# Patient Record
Sex: Male | Born: 1940 | Race: Black or African American | Hispanic: No
Health system: Southern US, Community
[De-identification: ages and names within clinical notes are randomized; demographics above are authoritative.]

---

## 2004-07-25 ENCOUNTER — Other Ambulatory Visit: Payer: Self-pay

## 2004-07-26 ENCOUNTER — Other Ambulatory Visit: Payer: Self-pay

## 2004-07-27 ENCOUNTER — Other Ambulatory Visit: Payer: Self-pay

## 2008-11-03 ENCOUNTER — Emergency Department: Payer: Self-pay | Admitting: Internal Medicine

## 2010-04-07 ENCOUNTER — Emergency Department: Payer: Self-pay | Admitting: Emergency Medicine

## 2010-08-09 ENCOUNTER — Inpatient Hospital Stay: Payer: Self-pay | Admitting: Internal Medicine

## 2010-08-29 ENCOUNTER — Ambulatory Visit: Payer: Self-pay | Admitting: Rheumatology

## 2010-09-19 ENCOUNTER — Emergency Department: Payer: Self-pay | Admitting: Emergency Medicine

## 2011-10-16 ENCOUNTER — Ambulatory Visit: Payer: Self-pay | Admitting: Neurology

## 2012-02-05 ENCOUNTER — Ambulatory Visit: Payer: Self-pay | Admitting: Neurology

## 2012-07-18 LAB — CBC WITH DIFFERENTIAL/PLATELET
Eosinophil #: 0.1 10*3/uL (ref 0.0–0.7)
Eosinophil %: 1.7 %
HCT: 37.4 % — ABNORMAL LOW (ref 40.0–52.0)
Lymphocyte #: 1.4 10*3/uL (ref 1.0–3.6)
MCH: 31.6 pg (ref 26.0–34.0)
MCHC: 33.5 g/dL (ref 32.0–36.0)
MCV: 94 fL (ref 80–100)
Monocyte #: 0.2 x10 3/mm (ref 0.2–1.0)
Neutrophil #: 2.4 10*3/uL (ref 1.4–6.5)
Neutrophil %: 58.4 %
Platelet: 167 10*3/uL (ref 150–440)
RBC: 3.96 10*6/uL — ABNORMAL LOW (ref 4.40–5.90)
RDW: 17.2 % — ABNORMAL HIGH (ref 11.5–14.5)

## 2012-07-18 LAB — COMPREHENSIVE METABOLIC PANEL
Alkaline Phosphatase: 88 U/L (ref 50–136)
Bilirubin,Total: 0.7 mg/dL (ref 0.2–1.0)
Calcium, Total: 7.9 mg/dL — ABNORMAL LOW (ref 8.5–10.1)
Co2: 32 mmol/L (ref 21–32)
Creatinine: 1.61 mg/dL — ABNORMAL HIGH (ref 0.60–1.30)
EGFR (Non-African Amer.): 43 — ABNORMAL LOW
Glucose: 88 mg/dL (ref 65–99)
Osmolality: 285 (ref 275–301)
Potassium: 2.6 mmol/L — ABNORMAL LOW (ref 3.5–5.1)
SGPT (ALT): 11 U/L — ABNORMAL LOW (ref 12–78)
Sodium: 143 mmol/L (ref 136–145)

## 2012-07-18 LAB — TROPONIN I: Troponin-I: 0.04 ng/mL

## 2012-07-18 LAB — SEDIMENTATION RATE: Erythrocyte Sed Rate: 61 mm/hr — ABNORMAL HIGH (ref 0–20)

## 2012-07-19 LAB — BASIC METABOLIC PANEL
BUN: 14 mg/dL (ref 7–18)
Creatinine: 1.27 mg/dL (ref 0.60–1.30)
EGFR (African American): >60
EGFR (Non-African Amer.): 57 — ABNORMAL LOW
Glucose: 92 mg/dL (ref 65–99)
Osmolality: 289 (ref 275–301)
Sodium: 145 mmol/L (ref 136–145)

## 2012-07-19 LAB — PHOSPHORUS: Phosphorus: 1.7 mg/dL — ABNORMAL LOW (ref 2.5–4.9)

## 2012-07-19 LAB — TSH: Thyroid Stimulating Horm: 2.41 u[IU]/mL

## 2012-07-20 ENCOUNTER — Inpatient Hospital Stay: Payer: Self-pay

## 2012-07-20 LAB — BASIC METABOLIC PANEL
Calcium, Total: 7.5 mg/dL — ABNORMAL LOW (ref 8.5–10.1)
Co2: 26 mmol/L (ref 21–32)
Creatinine: 1.29 mg/dL (ref 0.60–1.30)
EGFR (African American): >60
EGFR (Non-African Amer.): 56 — ABNORMAL LOW
Potassium: 3 mmol/L — ABNORMAL LOW (ref 3.5–5.1)
Sodium: 141 mmol/L (ref 136–145)

## 2012-07-20 LAB — MAGNESIUM: Magnesium: 1.4 mg/dL — ABNORMAL LOW

## 2012-07-20 LAB — PHOSPHORUS: Phosphorus: 2.1 mg/dL — ABNORMAL LOW (ref 2.5–4.9)

## 2012-07-21 LAB — BASIC METABOLIC PANEL
Anion Gap: 6 — ABNORMAL LOW (ref 7–16)
Calcium, Total: 7.6 mg/dL — ABNORMAL LOW (ref 8.5–10.1)
Co2: 25 mmol/L (ref 21–32)
EGFR (African American): >60
EGFR (Non-African Amer.): 58 — ABNORMAL LOW
Glucose: 80 mg/dL (ref 65–99)

## 2012-07-21 LAB — PROTEIN ELECTROPHORESIS(ARMC)

## 2012-07-21 LAB — PHOSPHORUS: Phosphorus: 2.2 mg/dL — ABNORMAL LOW (ref 2.5–4.9)

## 2012-07-22 LAB — UR PROT ELECTROPHORESIS, URINE RANDOM

## 2012-08-29 LAB — COMPREHENSIVE METABOLIC PANEL
Albumin: 3.9 g/dL (ref 3.4–5.0)
BUN: 20 mg/dL — ABNORMAL HIGH (ref 7–18)
Creatinine: 1.88 mg/dL — ABNORMAL HIGH (ref 0.60–1.30)
EGFR (African American): 41 — ABNORMAL LOW
Glucose: 88 mg/dL (ref 65–99)
Osmolality: 280 (ref 275–301)
Potassium: 3.1 mmol/L — ABNORMAL LOW (ref 3.5–5.1)
SGOT(AST): 61 U/L — ABNORMAL HIGH (ref 15–37)
SGPT (ALT): 16 U/L (ref 12–78)
Total Protein: 10.1 g/dL — ABNORMAL HIGH (ref 6.4–8.2)

## 2012-08-29 LAB — URINALYSIS, COMPLETE
Bacteria: NONE SEEN
Bilirubin,UR: NEGATIVE
Glucose,UR: NEGATIVE mg/dL (ref 0–75)
Ketone: NEGATIVE
Leukocyte Esterase: NEGATIVE
Nitrite: NEGATIVE
Protein: NEGATIVE
Specific Gravity: 1.009 (ref 1.003–1.030)
Squamous Epithelial: NONE SEEN
WBC UR: <1 /HPF (ref 0–5)

## 2012-08-29 LAB — CBC
HGB: 16.4 g/dL (ref 13.0–18.0)
MCH: 31.6 pg (ref 26.0–34.0)
MCHC: 33.9 g/dL (ref 32.0–36.0)
RBC: 5.18 10*6/uL (ref 4.40–5.90)
WBC: 11.6 10*3/uL — ABNORMAL HIGH (ref 3.8–10.6)

## 2012-08-30 ENCOUNTER — Inpatient Hospital Stay: Payer: Self-pay

## 2012-08-31 LAB — CBC WITH DIFFERENTIAL/PLATELET
Basophil #: 0 10*3/uL (ref 0.0–0.1)
Eosinophil %: 0.4 %
HCT: 31.1 % — ABNORMAL LOW (ref 40.0–52.0)
HGB: 10.6 g/dL — ABNORMAL LOW (ref 13.0–18.0)
MCH: 31.8 pg (ref 26.0–34.0)
MCHC: 34 g/dL (ref 32.0–36.0)
Monocyte #: 0.4 x10 3/mm (ref 0.2–1.0)
Monocyte %: 6.1 %
Neutrophil #: 4 10*3/uL (ref 1.4–6.5)
Neutrophil %: 61.9 %
Platelet: 131 10*3/uL — ABNORMAL LOW (ref 150–440)
WBC: 6.5 10*3/uL (ref 3.8–10.6)

## 2012-08-31 LAB — BASIC METABOLIC PANEL
Anion Gap: 10 (ref 7–16)
BUN: 22 mg/dL — ABNORMAL HIGH (ref 7–18)
Calcium, Total: 7.9 mg/dL — ABNORMAL LOW (ref 8.5–10.1)
Chloride: 107 mmol/L (ref 98–107)
Co2: 28 mmol/L (ref 21–32)
Osmolality: 292 (ref 275–301)
Potassium: 3 mmol/L — ABNORMAL LOW (ref 3.5–5.1)

## 2012-09-01 LAB — BASIC METABOLIC PANEL
Anion Gap: 7 (ref 7–16)
BUN: 20 mg/dL — ABNORMAL HIGH (ref 7–18)
Calcium, Total: 7.9 mg/dL — ABNORMAL LOW (ref 8.5–10.1)
Chloride: 109 mmol/L — ABNORMAL HIGH (ref 98–107)
Co2: 28 mmol/L (ref 21–32)
Creatinine: 1.31 mg/dL — ABNORMAL HIGH (ref 0.60–1.30)
EGFR (African American): >60
EGFR (Non-African Amer.): 55 — ABNORMAL LOW
Glucose: 77 mg/dL (ref 65–99)
Osmolality: 288 (ref 275–301)
Potassium: 3.7 mmol/L (ref 3.5–5.1)
Sodium: 144 mmol/L (ref 136–145)

## 2012-09-01 LAB — CBC WITH DIFFERENTIAL/PLATELET
Basophil #: 0 10*3/uL (ref 0.0–0.1)
Basophil %: 0.6 %
Eosinophil #: 0.1 10*3/uL (ref 0.0–0.7)
Eosinophil %: 2.5 %
HCT: 33.1 % — ABNORMAL LOW (ref 40.0–52.0)
MCH: 32.1 pg (ref 26.0–34.0)
MCV: 94 fL (ref 80–100)
Monocyte %: 6.8 %
RBC: 3.54 10*6/uL — ABNORMAL LOW (ref 4.40–5.90)
RDW: 14.2 % (ref 11.5–14.5)
WBC: 4.8 10*3/uL (ref 3.8–10.6)

## 2012-09-01 LAB — MAGNESIUM: Magnesium: 1.6 mg/dL — ABNORMAL LOW

## 2012-09-02 LAB — CBC WITH DIFFERENTIAL/PLATELET
Basophil #: 0 10*3/uL (ref 0.0–0.1)
Basophil %: 0.7 %
Eosinophil #: 0.1 10*3/uL (ref 0.0–0.7)
HCT: 32.7 % — ABNORMAL LOW (ref 40.0–52.0)
Lymphocyte #: 1.9 10*3/uL (ref 1.0–3.6)
Lymphocyte %: 33.7 %
MCHC: 34.9 g/dL (ref 32.0–36.0)
Monocyte %: 7.2 %
Neutrophil #: 3.2 10*3/uL (ref 1.4–6.5)
Platelet: 150 10*3/uL (ref 150–440)
RBC: 3.54 10*6/uL — ABNORMAL LOW (ref 4.40–5.90)
RDW: 14.5 % (ref 11.5–14.5)
WBC: 5.7 10*3/uL (ref 3.8–10.6)

## 2012-09-09 ENCOUNTER — Emergency Department: Payer: Self-pay | Admitting: Emergency Medicine

## 2012-09-09 LAB — URINALYSIS, COMPLETE
Glucose,UR: NEGATIVE mg/dL (ref 0–75)
Ph: 7 (ref 4.5–8.0)
RBC,UR: 20 /HPF (ref 0–5)
Squamous Epithelial: <1
WBC UR: 87 /HPF (ref 0–5)

## 2012-09-09 LAB — COMPREHENSIVE METABOLIC PANEL
Alkaline Phosphatase: 72 U/L (ref 50–136)
Anion Gap: 12 (ref 7–16)
BUN: 15 mg/dL (ref 7–18)
Bilirubin,Total: 0.6 mg/dL (ref 0.2–1.0)
Calcium, Total: 7.7 mg/dL — ABNORMAL LOW (ref 8.5–10.1)
Chloride: 109 mmol/L — ABNORMAL HIGH (ref 98–107)
Co2: 21 mmol/L (ref 21–32)
Creatinine: 1.33 mg/dL — ABNORMAL HIGH (ref 0.60–1.30)
EGFR (Non-African Amer.): 54 — ABNORMAL LOW
Glucose: 90 mg/dL (ref 65–99)
Potassium: 3.5 mmol/L (ref 3.5–5.1)
SGOT(AST): 9 U/L — ABNORMAL LOW (ref 15–37)
SGPT (ALT): 12 U/L (ref 12–78)
Total Protein: 6.8 g/dL (ref 6.4–8.2)

## 2012-09-09 LAB — CBC
HCT: 33.1 % — ABNORMAL LOW (ref 40.0–52.0)
HGB: 11.3 g/dL — ABNORMAL LOW (ref 13.0–18.0)
MCH: 31.8 pg (ref 26.0–34.0)
MCHC: 34.1 g/dL (ref 32.0–36.0)
MCV: 93 fL (ref 80–100)
RBC: 3.56 10*6/uL — ABNORMAL LOW (ref 4.40–5.90)
WBC: 7.1 10*3/uL (ref 3.8–10.6)

## 2012-09-09 LAB — TROPONIN I: Troponin-I: 0.03 ng/mL

## 2012-09-09 LAB — CK TOTAL AND CKMB (NOT AT ARMC)
CK, Total: 34 U/L — ABNORMAL LOW (ref 35–232)
CK-MB: 0.6 ng/mL (ref 0.5–3.6)

## 2012-09-14 LAB — CULTURE, BLOOD (SINGLE)

## 2014-06-04 ENCOUNTER — Inpatient Hospital Stay: Payer: Self-pay | Admitting: Internal Medicine

## 2014-06-04 LAB — BASIC METABOLIC PANEL
ANION GAP: 7 (ref 7–16)
BUN: 25 mg/dL — AB (ref 7–18)
CREATININE: 2.07 mg/dL — AB (ref 0.60–1.30)
Calcium, Total: 8.8 mg/dL (ref 8.5–10.1)
Chloride: 114 mmol/L — ABNORMAL HIGH (ref 98–107)
Co2: 23 mmol/L (ref 21–32)
EGFR (African American): 36 — ABNORMAL LOW
GFR CALC NON AF AMER: 31 — AB
Glucose: 98 mg/dL (ref 65–99)
OSMOLALITY: 291 (ref 275–301)
POTASSIUM: 3.7 mmol/L (ref 3.5–5.1)
SODIUM: 144 mmol/L (ref 136–145)

## 2014-06-04 LAB — URINALYSIS, COMPLETE
Glucose,UR: NEGATIVE mg/dL (ref 0–75)
Leukocyte Esterase: NEGATIVE
Nitrite: NEGATIVE
Ph: 5 (ref 4.5–8.0)
RBC,UR: 37 /HPF (ref 0–5)
Specific Gravity: 1.028 (ref 1.003–1.030)

## 2014-06-04 LAB — CBC
HCT: 38.6 % — AB (ref 40.0–52.0)
HGB: 12.7 g/dL — ABNORMAL LOW (ref 13.0–18.0)
MCH: 30.5 pg (ref 26.0–34.0)
MCHC: 32.9 g/dL (ref 32.0–36.0)
MCV: 93 fL (ref 80–100)
Platelet: 194 10*3/uL (ref 150–440)
RBC: 4.17 10*6/uL — ABNORMAL LOW (ref 4.40–5.90)
RDW: 15.7 % — ABNORMAL HIGH (ref 11.5–14.5)
WBC: 5.7 10*3/uL (ref 3.8–10.6)

## 2014-06-04 LAB — CK TOTAL AND CKMB (NOT AT ARMC)
CK, Total: 40 U/L
CK-MB: 0.7 ng/mL (ref 0.5–3.6)

## 2014-06-04 LAB — TROPONIN I: TROPONIN-I: 0.03 ng/mL

## 2014-06-05 ENCOUNTER — Ambulatory Visit: Payer: Self-pay | Admitting: Neurology

## 2014-06-05 LAB — CBC WITH DIFFERENTIAL/PLATELET
Basophil #: 0.1 10*3/uL (ref 0.0–0.1)
Basophil %: 0.9 %
Eosinophil #: 0.1 10*3/uL (ref 0.0–0.7)
Eosinophil %: 2.3 %
HCT: 37.9 % — AB (ref 40.0–52.0)
HGB: 12.4 g/dL — AB (ref 13.0–18.0)
LYMPHS PCT: 25.9 %
Lymphocyte #: 1.4 10*3/uL (ref 1.0–3.6)
MCH: 30.5 pg (ref 26.0–34.0)
MCHC: 32.8 g/dL (ref 32.0–36.0)
MCV: 93 fL (ref 80–100)
Monocyte #: 0.3 x10 3/mm (ref 0.2–1.0)
Monocyte %: 5.7 %
Neutrophil #: 3.6 10*3/uL (ref 1.4–6.5)
Neutrophil %: 65.2 %
Platelet: 172 10*3/uL (ref 150–440)
RBC: 4.08 10*6/uL — AB (ref 4.40–5.90)
RDW: 16.1 % — ABNORMAL HIGH (ref 11.5–14.5)
WBC: 5.6 10*3/uL (ref 3.8–10.6)

## 2014-06-05 LAB — BASIC METABOLIC PANEL
Anion Gap: 8 (ref 7–16)
BUN: 22 mg/dL — ABNORMAL HIGH (ref 7–18)
CALCIUM: 8 mg/dL — AB (ref 8.5–10.1)
CREATININE: 1.54 mg/dL — AB (ref 0.60–1.30)
Chloride: 113 mmol/L — ABNORMAL HIGH (ref 98–107)
Co2: 22 mmol/L (ref 21–32)
EGFR (African American): 51 — ABNORMAL LOW
EGFR (Non-African Amer.): 44 — ABNORMAL LOW
Glucose: 79 mg/dL (ref 65–99)
Osmolality: 287 (ref 275–301)
Potassium: 3.2 mmol/L — ABNORMAL LOW (ref 3.5–5.1)
SODIUM: 143 mmol/L (ref 136–145)

## 2014-06-05 LAB — LIPID PANEL
Cholesterol: 129 mg/dL (ref 0–200)
HDL: 35 mg/dL — AB (ref 40–60)
Ldl Cholesterol, Calc: 83 mg/dL (ref 0–100)
TRIGLYCERIDES: 56 mg/dL (ref 0–200)
VLDL CHOLESTEROL, CALC: 11 mg/dL (ref 5–40)

## 2014-06-06 LAB — BASIC METABOLIC PANEL
Anion Gap: 6 — ABNORMAL LOW (ref 7–16)
BUN: 22 mg/dL — AB (ref 7–18)
CREATININE: 1.52 mg/dL — AB (ref 0.60–1.30)
Calcium, Total: 7.5 mg/dL — ABNORMAL LOW (ref 8.5–10.1)
Chloride: 115 mmol/L — ABNORMAL HIGH (ref 98–107)
Co2: 22 mmol/L (ref 21–32)
EGFR (Non-African Amer.): 45 — ABNORMAL LOW
GFR CALC AF AMER: 52 — AB
Glucose: 82 mg/dL (ref 65–99)
Osmolality: 287 (ref 275–301)
Potassium: 3.7 mmol/L (ref 3.5–5.1)
SODIUM: 143 mmol/L (ref 136–145)

## 2014-06-06 LAB — URINE CULTURE

## 2014-06-07 LAB — BASIC METABOLIC PANEL
ANION GAP: 7 (ref 7–16)
BUN: 16 mg/dL (ref 7–18)
CHLORIDE: 114 mmol/L — AB (ref 98–107)
Calcium, Total: 7.5 mg/dL — ABNORMAL LOW (ref 8.5–10.1)
Co2: 22 mmol/L (ref 21–32)
Creatinine: 1.22 mg/dL (ref 0.60–1.30)
EGFR (African American): >60
EGFR (Non-African Amer.): 59 — ABNORMAL LOW
Glucose: 78 mg/dL (ref 65–99)
Osmolality: 285 (ref 275–301)
Potassium: 3.7 mmol/L (ref 3.5–5.1)
SODIUM: 143 mmol/L (ref 136–145)

## 2014-06-09 LAB — CULTURE, BLOOD (SINGLE)

## 2014-06-23 ENCOUNTER — Emergency Department: Payer: Self-pay | Admitting: Internal Medicine

## 2014-06-23 LAB — COMPREHENSIVE METABOLIC PANEL
ALBUMIN: 3.3 g/dL — AB (ref 3.4–5.0)
ALK PHOS: 79 U/L
ANION GAP: 8 (ref 7–16)
AST: 21 U/L (ref 15–37)
BUN: 40 mg/dL — AB (ref 7–18)
Bilirubin,Total: 0.4 mg/dL (ref 0.2–1.0)
Calcium, Total: 9.1 mg/dL (ref 8.5–10.1)
Chloride: 110 mmol/L — ABNORMAL HIGH (ref 98–107)
Co2: 22 mmol/L (ref 21–32)
Creatinine: 1.89 mg/dL — ABNORMAL HIGH (ref 0.60–1.30)
EGFR (African American): 40 — ABNORMAL LOW
GFR CALC NON AF AMER: 35 — AB
Glucose: 80 mg/dL (ref 65–99)
Osmolality: 288 (ref 275–301)
POTASSIUM: 5.6 mmol/L — AB (ref 3.5–5.1)
SGPT (ALT): 14 U/L
SODIUM: 140 mmol/L (ref 136–145)
Total Protein: 8.7 g/dL — ABNORMAL HIGH (ref 6.4–8.2)

## 2014-06-23 LAB — CBC WITH DIFFERENTIAL/PLATELET
Basophil #: 0.1 10*3/uL (ref 0.0–0.1)
Basophil %: 1.4 %
Eosinophil #: 0.1 10*3/uL (ref 0.0–0.7)
Eosinophil %: 2.9 %
HCT: 41.1 % (ref 40.0–52.0)
HGB: 13.1 g/dL (ref 13.0–18.0)
Lymphocyte #: 1.3 10*3/uL (ref 1.0–3.6)
Lymphocyte %: 26.4 %
MCH: 30 pg (ref 26.0–34.0)
MCHC: 31.8 g/dL — AB (ref 32.0–36.0)
MCV: 94 fL (ref 80–100)
Monocyte #: 0.3 x10 3/mm (ref 0.2–1.0)
Monocyte %: 5.1 %
Neutrophil #: 3.2 10*3/uL (ref 1.4–6.5)
Neutrophil %: 64.2 %
Platelet: 218 10*3/uL (ref 150–440)
RBC: 4.36 10*6/uL — ABNORMAL LOW (ref 4.40–5.90)
RDW: 15.9 % — ABNORMAL HIGH (ref 11.5–14.5)
WBC: 5 10*3/uL (ref 3.8–10.6)

## 2014-06-23 LAB — URINALYSIS, COMPLETE
BILIRUBIN, UR: NEGATIVE
Bacteria: NONE SEEN
Glucose,UR: NEGATIVE mg/dL (ref 0–75)
KETONE: NEGATIVE
Leukocyte Esterase: NEGATIVE
Nitrite: NEGATIVE
Ph: 5 (ref 4.5–8.0)
Protein: NEGATIVE
Specific Gravity: 1.014 (ref 1.003–1.030)
Squamous Epithelial: <1

## 2014-06-23 LAB — TROPONIN I: Troponin-I: <0.02 ng/mL

## 2014-06-28 ENCOUNTER — Other Ambulatory Visit: Payer: Self-pay | Admitting: Internal Medicine

## 2014-06-28 LAB — BASIC METABOLIC PANEL
Anion Gap: 7 (ref 7–16)
BUN: 48 mg/dL — ABNORMAL HIGH (ref 7–18)
CHLORIDE: 112 mmol/L — AB (ref 98–107)
Calcium, Total: 8.2 mg/dL — ABNORMAL LOW (ref 8.5–10.1)
Co2: 23 mmol/L (ref 21–32)
Creatinine: 2.21 mg/dL — ABNORMAL HIGH (ref 0.60–1.30)
EGFR (African American): 33 — ABNORMAL LOW
EGFR (Non-African Amer.): 29 — ABNORMAL LOW
Glucose: 94 mg/dL (ref 65–99)
OSMOLALITY: 295 (ref 275–301)
POTASSIUM: 5.7 mmol/L — AB (ref 3.5–5.1)
SODIUM: 142 mmol/L (ref 136–145)

## 2014-08-31 IMAGING — US US CAROTID DUPLEX BILAT
1 series · 13 of 24 positions shown · non-contrast
Comparison: Prior brain MRI 06/05/2014 ; prior duplex carotid
ultrasound 08/09/2010

CLINICAL DATA: Acute cerebral vascular accident

EXAM:
BILATERAL CAROTID DUPLEX ULTRASOUND
TECHNIQUE: Gray scale imaging, color Doppler and duplex ultrasound were
performed of bilateral carotid and vertebral arteries in the neck.

[Series 1: us carotid duplex bilat · 0.05mm/px · 13 of 55 slices shown]
[im 1/55]
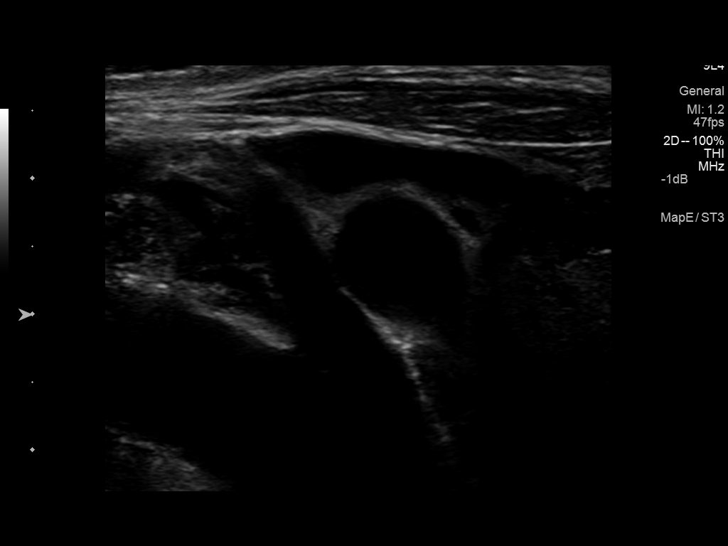
[im 5/55]
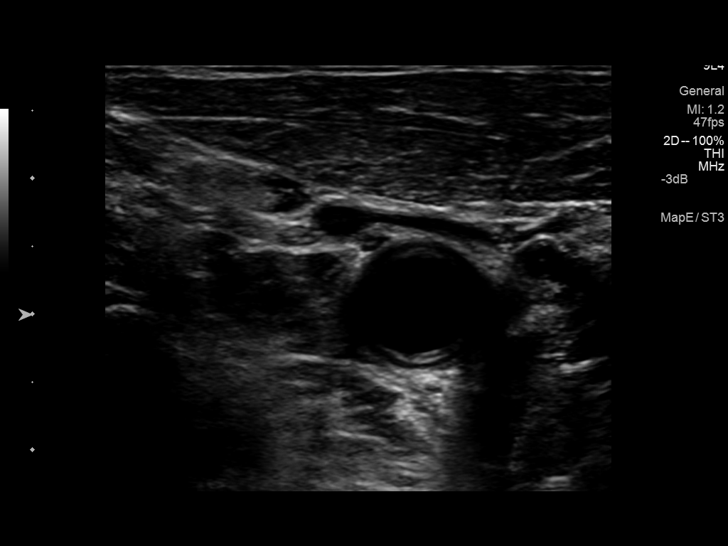
[im 10/55]
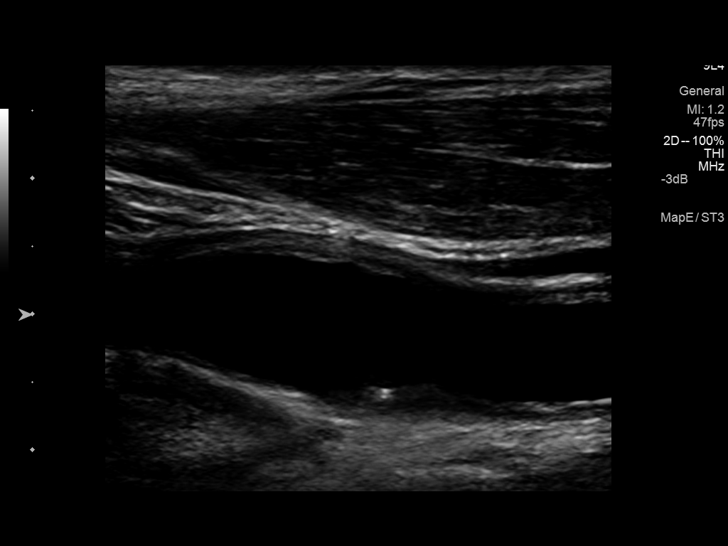
[im 15/55]
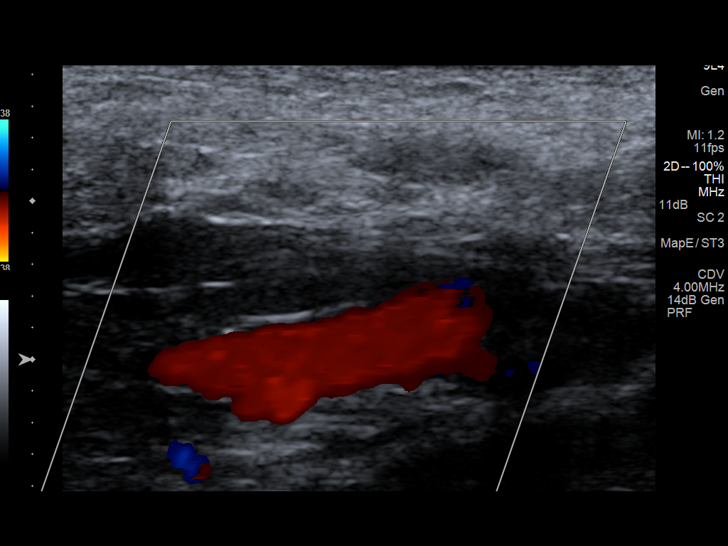
[im 19/55]
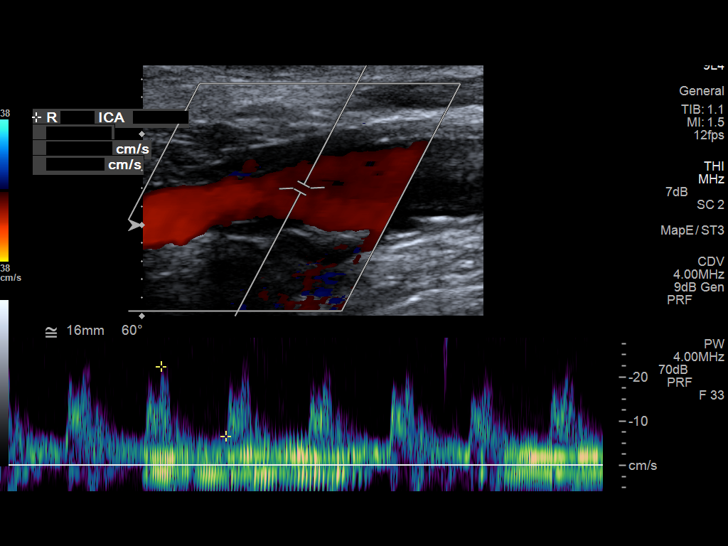
[im 24/55]
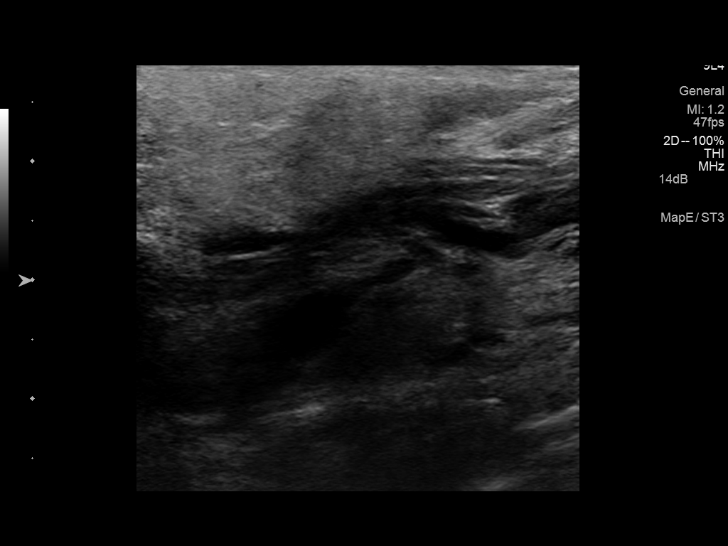
[im 29/55]
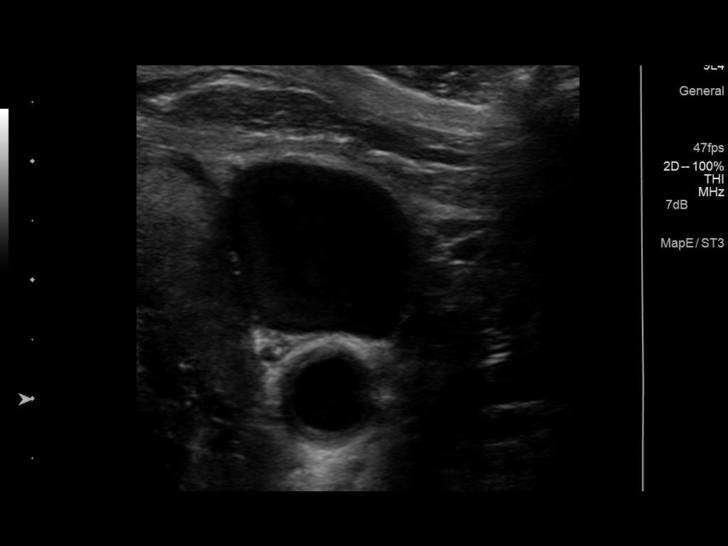
[im 31/55]
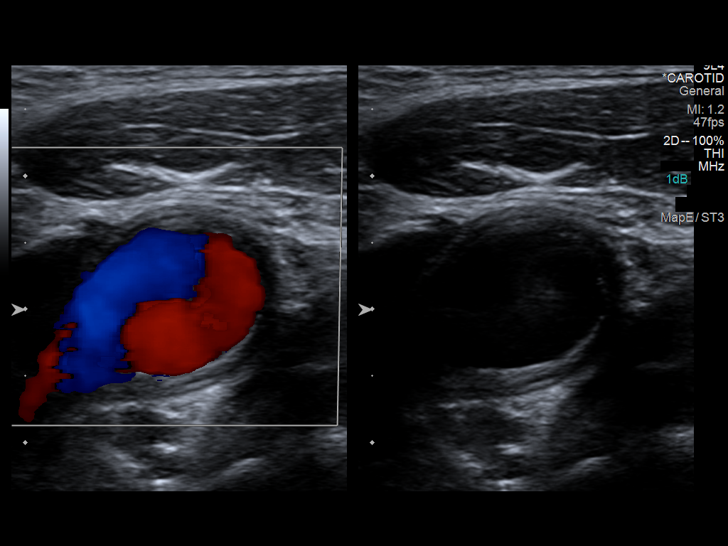
[im 36/55]
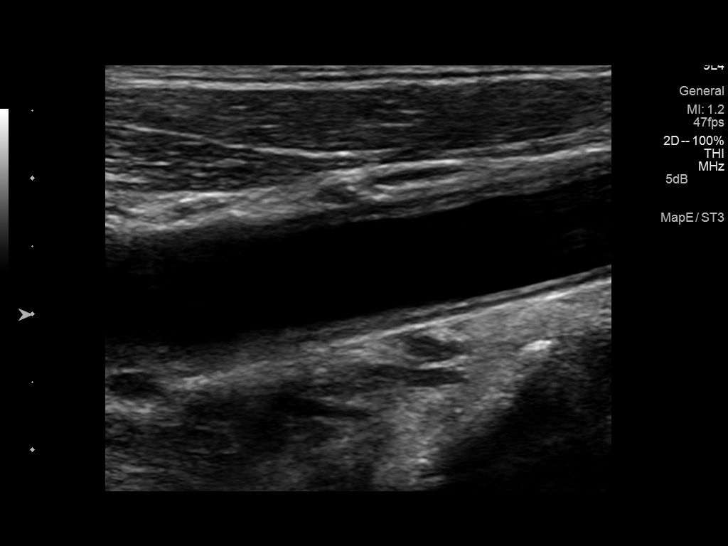
[im 40/55]
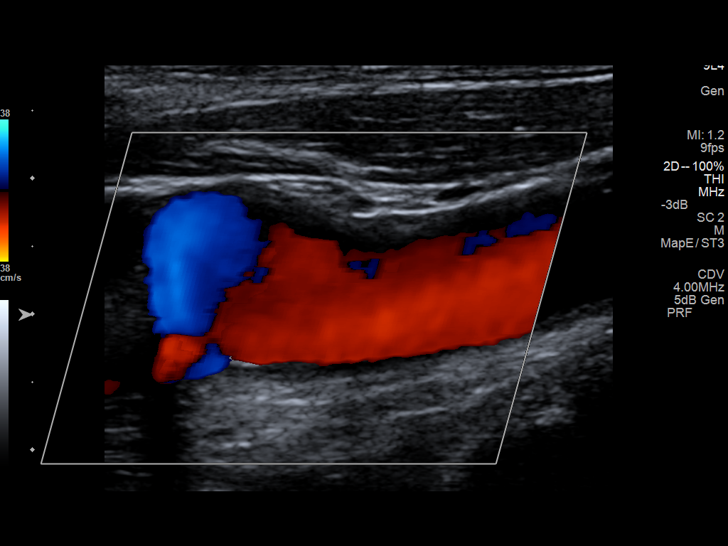
[im 45/55]
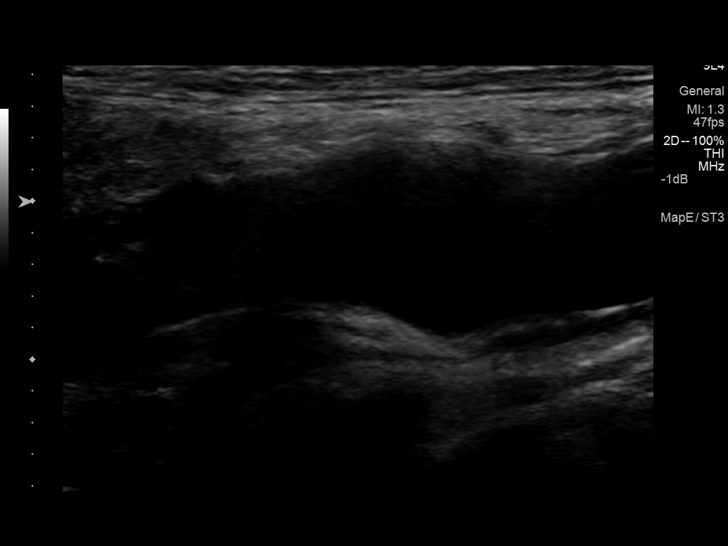
[im 50/55]
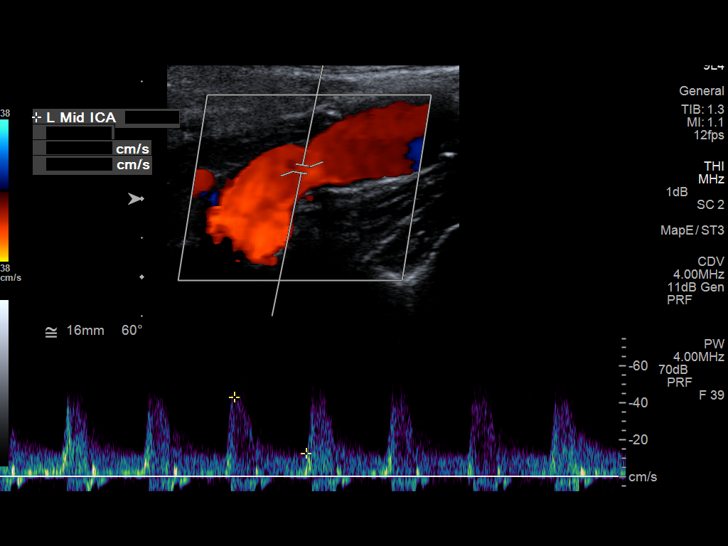
[im 55/55]
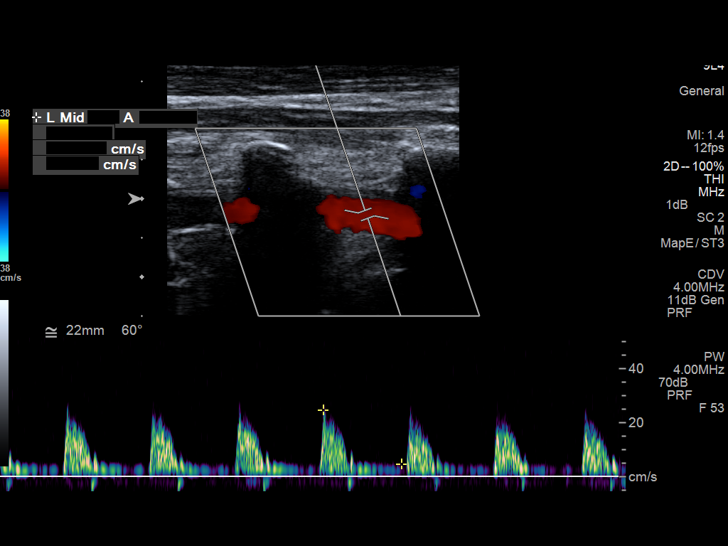

[13 of 24 positions shown; findings below may reference images not displayed]

FINDINGS: Criteria: Quantification of carotid stenosis is based on velocity
parameters that correlate the residual internal carotid diameter
with NASCET-based stenosis levels, using the diameter of the distal
internal carotid lumen as the denominator for stenosis measurement.

The following velocity measurements were obtained:

RIGHT

ICA:  57/17 cm/sec

CCA:  113/16 cm/sec

SYSTOLIC ICA/CCA RATIO:

DIASTOLIC ICA/CCA RATIO:

ECA:  46 cm/sec

LEFT

ICA:  43/13 cm/sec

CCA:  92/15 cm/sec

SYSTOLIC ICA/CCA RATIO:

DIASTOLIC ICA/CCA RATIO:

ECA:  48 cm/sec

RIGHT CAROTID ARTERY: Trace atherosclerotic plaque in the carotid
bifurcation without evidence of significant stenosis.

RIGHT VERTEBRAL ARTERY:  Patent with normal antegrade flow.

LEFT CAROTID ARTERY: Trace smooth atherosclerotic plaque in the
carotid bifurcation without evidence of stenosis.

LEFT VERTEBRAL ARTERY:  Patent with normal antegrade flow.
IMPRESSION: 1. Trace heterogeneous but relatively smooth plaque in the bilateral
carotid bifurcations without evidence of ICA stenosis.
2. Vertebral arteries remain patent with normal antegrade flow.

## 2014-09-18 IMAGING — CT CT HEAD WITHOUT CONTRAST
1 series · 16 of 30 positions shown, 20 images · non-contrast
Comparison: CT 06/04/2014

CLINICAL DATA: Mental status change

EXAM:
CT HEAD WITHOUT CONTRAST
TECHNIQUE: Contiguous axial images were obtained from the base of the skull
through the vertex without intravenous contrast.

[Series 2: soft tissue · axial · 0.42mm/px · z∈[-34,+100]mm · 16 of 30 slices shown, 20 images]
[im 2/30  brain]
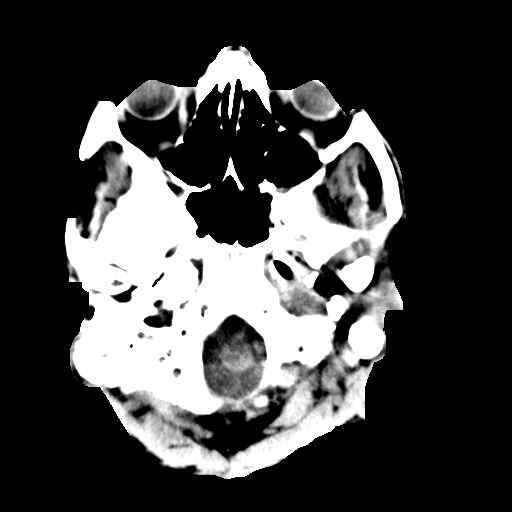
[im 2/30  bone]
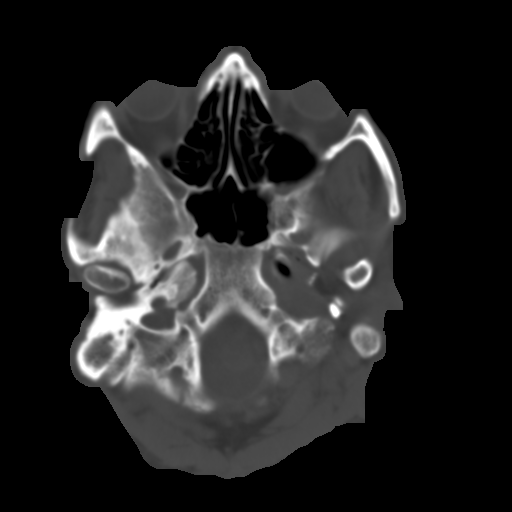
[im 4/30  brain]
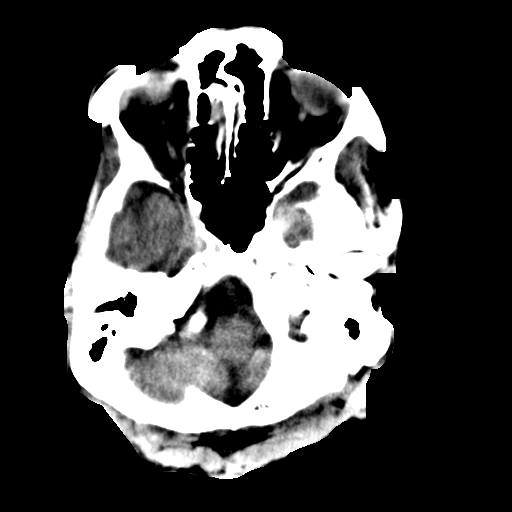
[im 6/30  brain]
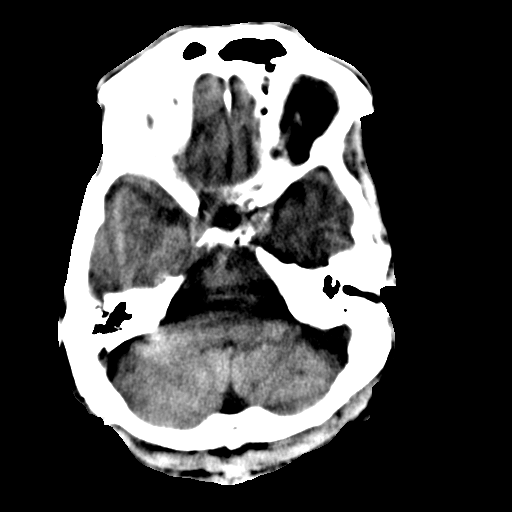
[im 8/30  brain]
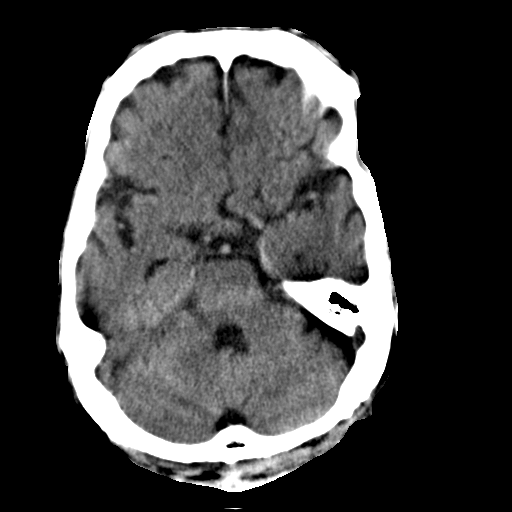
[im 9/30  brain]
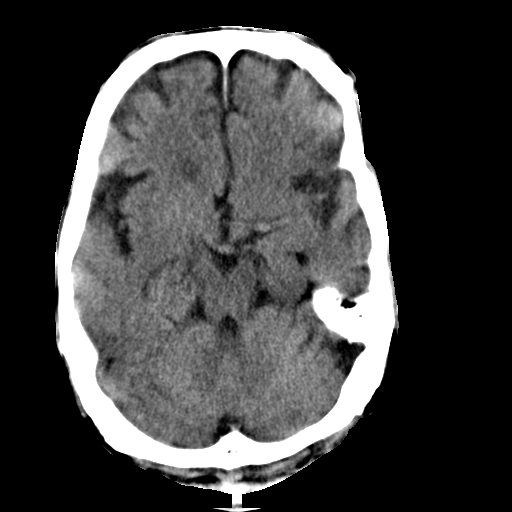
[im 9/30  bone]
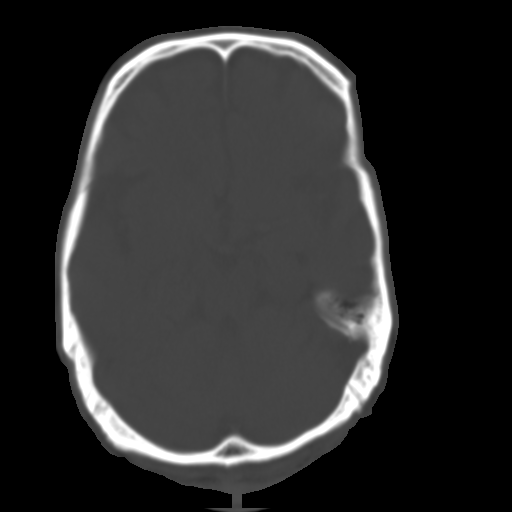
[im 11/30  brain]
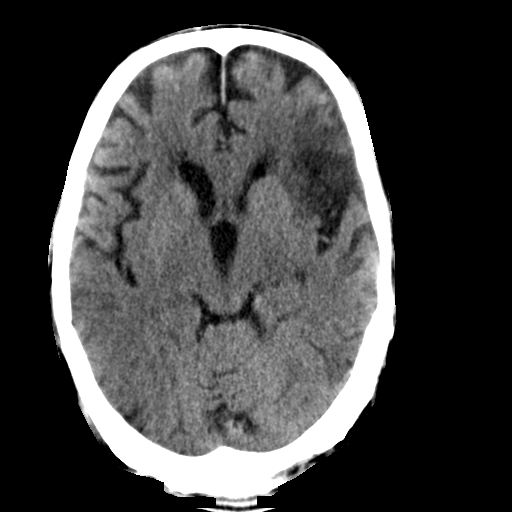
[im 13/30  brain]
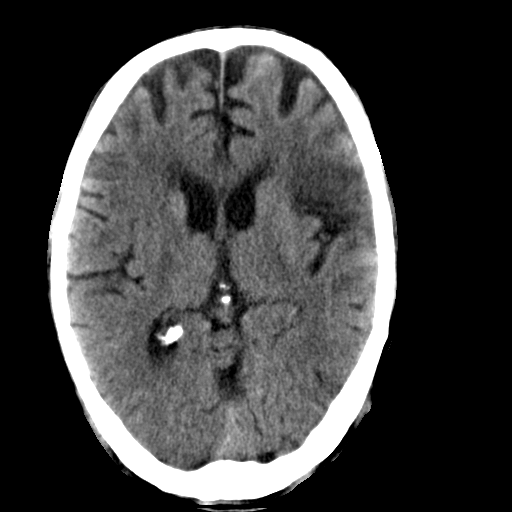
[im 15/30  brain]
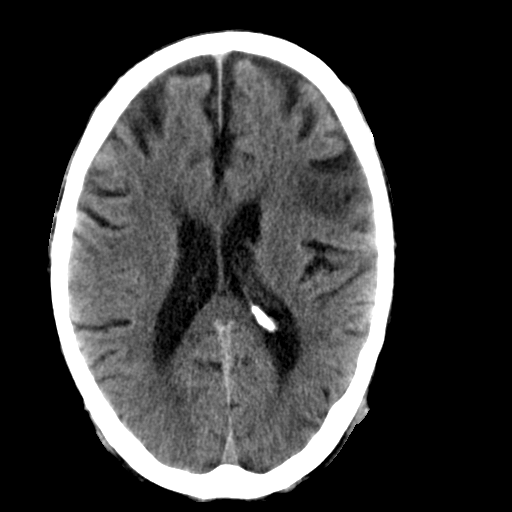
[im 16/30  brain]
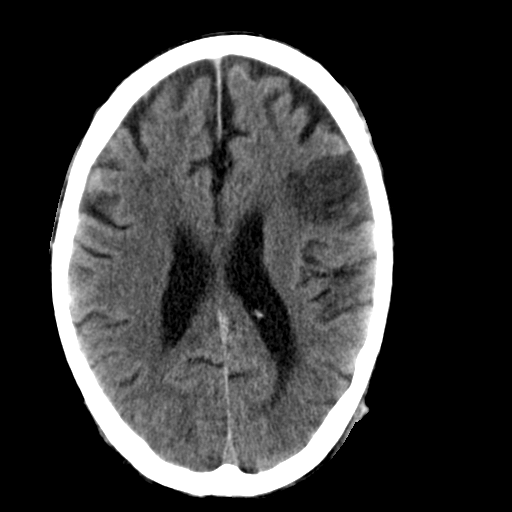
[im 16/30  bone]
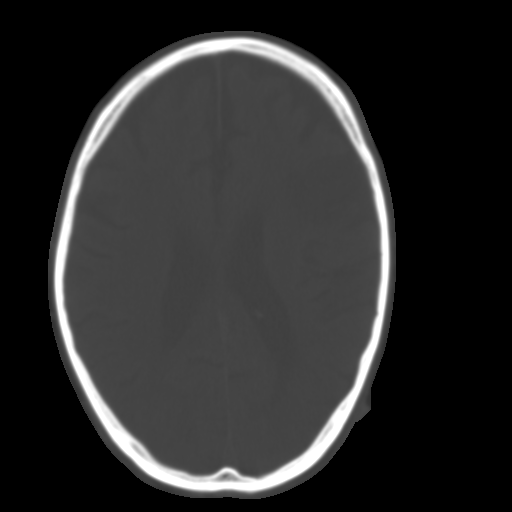
[im 18/30  brain]
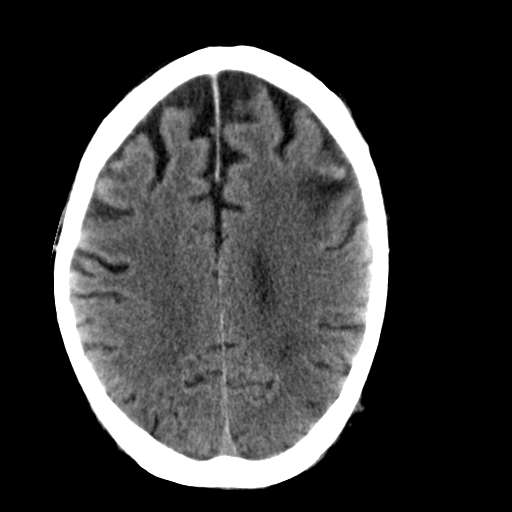
[im 20/30  brain]
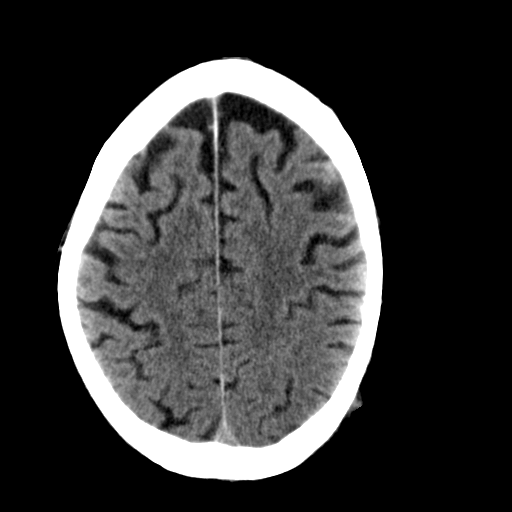
[im 22/30  brain]
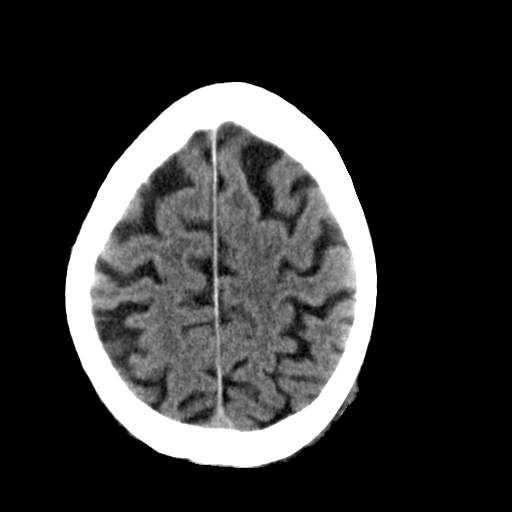
[im 23/30  brain]
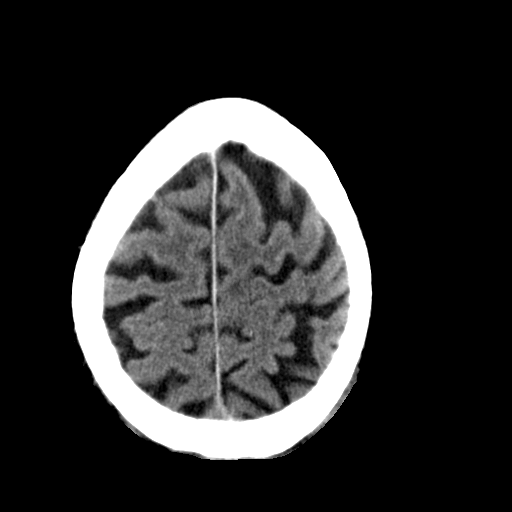
[im 23/30  bone]
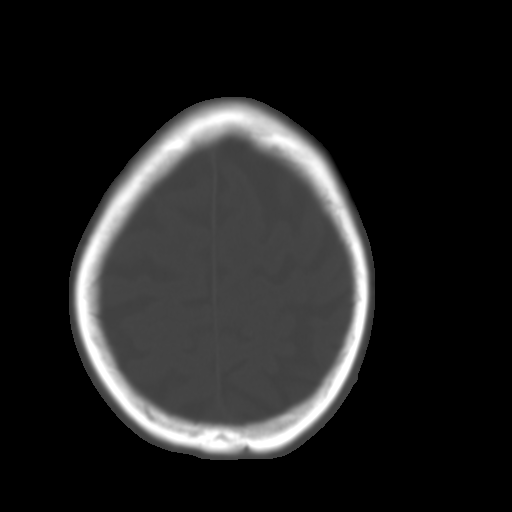
[im 25/30  brain]
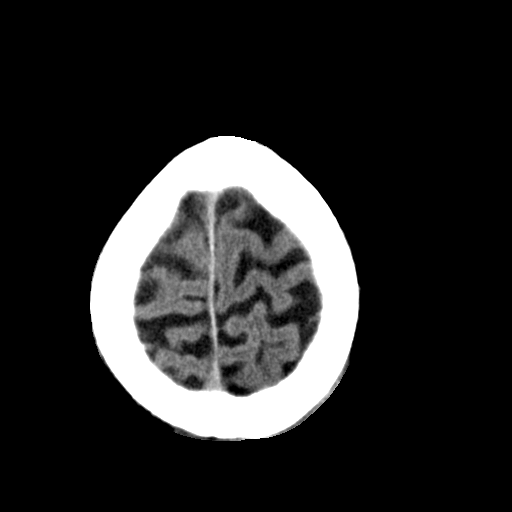
[im 27/30  brain]
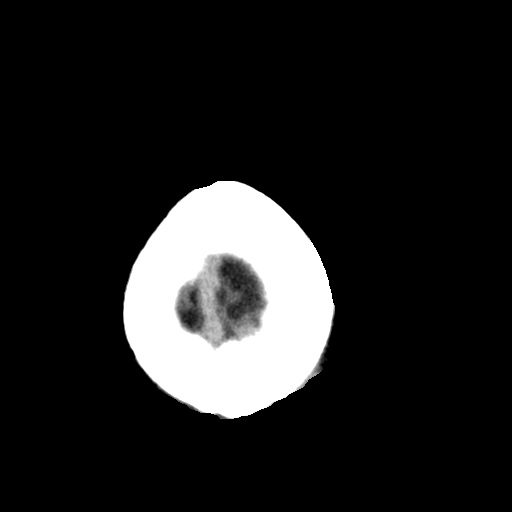
[im 29/30  brain]
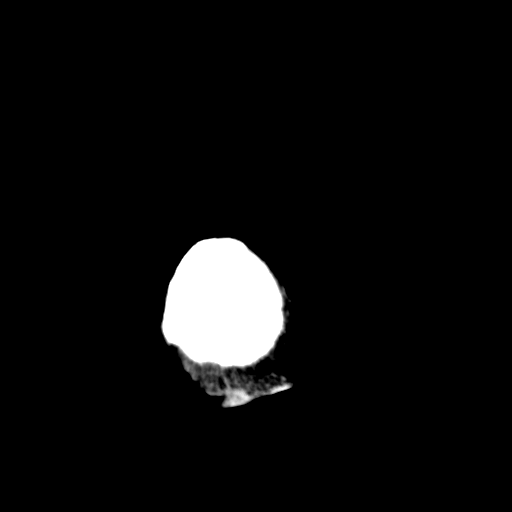

[16 of 30 positions shown; findings below may reference images not displayed]

FINDINGS: Left frontal operculum infarct shows low-density compatible with
edema from recent infarction. This is unchanged. No associated
hemorrhage.

MRI also identified a small area of acute infarct in the right
splenium which is not identified on CT

Generalized atrophy. Mild chronic microvascular ischemic change in
the white matter. No new infarct since the prior CT. Negative for
hemorrhage or mass.
IMPRESSION: Subacute infarct left frontal lobe unchanged. No acute change since
the prior study.

## 2015-02-22 NOTE — Consult Note (Signed)
    General Aspect 74 year old male with history of coronary artery disease.  He was in his primary care provider's office getting an x-ray when he became lightheaded and suffered a syncopal episode.  He was admitted where he has had no further symptoms.  He is ruled out for myocardial infarction.  He does have a history of orthostatic hypotension and was on Florinef.  Echocardiogram completed revealed preserved LV function with a question of possible PFO although endomyocardial structures were difficult to assess.   Physical Exam:   GEN well developed, well nourished, no acute distress    HEENT PERRL, hearing intact to voice    NECK supple    RESP normal resp effort  clear BS    CARD Regular rate and rhythm  Murmur    Murmur Systolic    Systolic Murmur axilla    ABD no hernia    LYMPH negative neck    EXTR negative edema    SKIN normal to palpation, No ulcers    NEURO cranial nerves intact, motor/sensory function intact    PSYCH A+O to time, place, person   Review of Systems:   Subjective/Chief Complaint syncope    General: No Complaints    Skin: No Complaints    ENT: No Complaints    Eyes: No Complaints    Neck: No Complaints    Respiratory: No Complaints    Cardiovascular: No Complaints    Gastrointestinal: No Complaints    Genitourinary: No Complaints    Vascular: No Complaints    Musculoskeletal: No Complaints    Neurologic: Fainting    Hematologic: No Complaints    Endocrine: No Complaints    Psychiatric: No Complaints    Review of Systems: All other systems were reviewed and found to be negative    Medications/Allergies Reviewed Medications/Allergies reviewed   EKG:   EKG NSR    Morphine: Itching, Rash    Impression 74-year-old male with history of coronary disease who was admitted with a syncopal episode.  Etiology is unclear but may have been orthostatic given the fact he was standing up getting an x-ray at the time it occurred  appeared he also has a history of orthostatic hypotension.  Echocardiogram done via a transthoracic approach suggested possible patent foramen ovale although endomyocardial structures are not well visualized.  Patient has not been diagnosed as before.  He has no evidence of neurologic deficit consistent with paradoxical emboli.  I discussed consideration for transesophageal echo with him explaining the risk and benefits and possible treatment options.  Patient defers this at this time.  Think this is not unreasonable given the lack of compelling evidence showing paradoxical emboli.  10 1.    Plan 1.  Continue with current medications 2.  Ambulate today and consider discharge is stable from a cardiovascular standpoint 3.  Will defer transesophageal echo at this time 4.  Consideration for TEE could be raised should the patient develop evidence of worsening right-sided failure or paradoxical emboli.   Electronic Signatures: Dalia HeadingFath, Kenneth A (MD)  (Signed 15-Sep-13 15:17)  Authored: General Aspect/Present Illness, History and Physical Exam, Review of System, EKG , Allergies, Impression/Plan   Last Updated: 15-Sep-13 15:17 by Dalia HeadingFath, Kenneth A (MD)

## 2015-02-22 NOTE — H&P (Signed)
PATIENT NAME:  [name], BIEHN MR#:  [id number] DATE OF BIRTH:  [birth year]  DATE OF ADMISSION:  08/30/2012  PRIMARY CARE PHYSICIAN: Adrian Prows, M.D.  CONSULTING PHYSICIAN: Lavone Orn, M.D.  REFERRING PHYSICIAN: Charlesetta Ivory, M.D.   CHIEF COMPLAINT:  Constipation.   HISTORY OF PRESENT ILLNESS: This is a 74 year old male with a history of coronary disease who presents with 3 to 4 days of constipation and inability to urinate. The patient was seen in the Emergency Department and we are asked to evaluate for admission. He has had three days of nausea and vomiting with poor appetite as well. Studies in the ED included an abdominal ultrasound significant only for showing sludge in the gallbladder, KUB which showed a nonspecific bowel gas pattern with no evidence of obstruction, and CT of the abdomen notable for mild hydronephrosis of the right kidney and urinary bladder distention. The patient denies dysuria or hematuria. He has had difficulty urinating for a few days He denies dysuira or hematuria. Labs show an elevated creatinine of 1.88, up from a baseline of 1.2. He had an admission in September for orthostasis and acute renal failure. During that admission an initial creatinine was 1.6 and with IV fluids his renal dysfunction resolved to baseline. Additionally, during that admission he had severe hypokalemia with a presenting potassium of 2.6. He was discharged after a two-day admission on supplemental potassium. He confirms compliance with his potassium. However, presenting potassium level was again low at 3.1.   PAST MEDICAL HISTORY:  1. Hospitalization 07/2012 with orthostasis, hypokalemia, and acute renal failure.  2. Coronary artery disease, status post acute myocardial infarction, angioplasty, and stent placement.  3. History of hyperlipidemia.   PAST SURGICAL HISTORY:  1. Appendectomy.  2. Total hip replacement.   ALLERGIES: No known drug allergies.   OUTPATIENT MEDICATIONS:   1. Fludrocortisone 0.1 mg 3 tabs daily.  2. K-Phos 250 mg b.i.d.  3. KCl 20 mEq b.i.d.  4. Magnesium oxide 400 mg twice a day.  SOCIAL HISTORY: The patient is single. He lives alone. He has smoked cigarettes since his teenage years and is currently smoking less than 1 pack per day. He does not drink alcohol and has not had any alcohol since 1981. He has three adult male children.   FAMILY HISTORY: Mother passed away from heart disease after an acute myocardial infarction. Father passed away from complications of kidney failure, details not known. He has siblings; they have no known medical problems. He has three adult sons who are reportedly all alcoholics.   REVIEW OF SYSTEMS:  GENERAL: No weight loss in the last few weeks. No fever. No chills. HEENT: No blurred vision. No sore throat. NECK: No neck pain. No dysphagia. CARDIAC: No chest pain. No palpitations. PULMONARY: No cough. No shortness of breath. ABDOMEN: He has suprapubic abdominal discomfort. He has constipation. He has nausea. He is vomiting. He has poor appetite. GU: He is unable to urinate. He denies dysuria or hematuria. ENDO: He denies heat or cold intolerance. LYMPH:  He denies recent bleeding or easy bruisability.  EXTREMITIES: He denies leg edema.  He does report generalized weakness. NEURO: He denies recent falls or headaches.   PHYSICAL EXAMINATION:  VITAL SIGNS: Temperature 97.7, pulse 88, respirations 18, blood pressure 159/90.   GENERAL: Well-developed African American male in no distress.   HEENT: Extraocular movements are intact.  Oropharynx is clear. Mucous membranes moist.   NECK: Supple. No thyromegaly.   CARDIAC: Regular rate and rhythm without audible murmur.  PULMONARY: Clear to auscultation bilaterally. No wheeze. No rhonchi. Good inspiratory effort.   ABDOMEN: Diffusely tender especially suprapubic, mildly distended. Positive bowel sounds.   EXTREMITIES: No edema is present.   SKIN: No rash or  dermatopathy is present.   NEUROLOGIC: Cranial nerves intact, nonfocal. No motor weakness elicited.   LYMPH: No submandibular or anterior cervical lymphadenopathy.   LABORATORY DATA: Glucose 88, BUN 20, creatinine 1.88, sodium 139, potassium 3.1, chloride 100, CO2 29, eGFR 41, calcium 9.4, lipase 47, albumin 3.9, AST 61, ALT 16, WBC 11.6, hemoglobin 16.4, hematocrit 48.4, platelets 213. Urinalysis essentially negative.   ASSESSMENT: This is a 74 year old male with evidence of acute renal failure due to bladder obstruction as well as severe constipation, nausea, and vomiting, possibly due to underlying renal failure and hypokalemia.   RECOMMENDATIONS:  1. Acute renal failure: This is likely due to a bladder outlet obstruction causing bladder distension and hydronephrosis. He will have a Foley catheter placed. We will monitor his ins and outs. We will monitor his creatinine. We will continue IV fluids at a gentle rate at 75 mL/h at this time. He has already  received nearly 2 liters of normal saline in the ED. Anticipate withdrawing Foley in 1 to 2 days and reassessing his ability to urinate. If he is still having trouble, we may need to investigate for causes of outlet obstruction such as enlarged prostate.   2. Hypokalemia: Etiology not clear. He had been on fludrocortisone. Therefore, with hypokalemia I question whether he has adrenal insufficiency. He did have a cortisol drawn during the last admission. It appears that was a morning cortisol and the level was 7, which is low. I plan to perform a cosyntropin stimulation test.  I will replace his potassium both IV and p.o. and follow daily potassium levels.  3. History of hypomagnesemia: We will reassess a magnesium level and continue his magnesium supplementation.  4. Constipation: The patient will be given daily Colace and he also requested an enema, so that will  be done as well.  5. Hypertension: Blood pressure is currently elevated; therefore, I  plan to hold his fludrocortisone. Should he have evidence of orthostatic hypotension, as he did during his last admission, we could consider restarting a lower dose of the fludrocortisone.  6. Abdominal pain: This is likely due to his bladder obstruction, and should improve with the Foley catheter use.  7. Nausea, vomiting: I plan to give Zofran as needed. Hopefully, this will also improve with IV fluids and correction of electrolyte balance.  8. CODE STATUS: The patient is FULL CODE, FULL CARE.   ____________________________ A. Lavone Orn, MD ams:bjt D: 08/30/2012 09:42:52 ET T: 08/30/2012 11:17:57 ET JOB#: [id number]  cc: A. Lavone Orn, MD, <Dictator> Cheral Marker. Ola Spurr, MD Gracy Bruins Llewyn Heap MD ELECTRONICALLY SIGNED 09/05/2012 12:56

## 2015-02-22 NOTE — Discharge Summary (Signed)
PATIENT NAME:  [name], Armel I MR#:  [id number] DATE OF BIRTH:  11/05/1941  DATE OF ADMISSION:  08/30/2012 DATE OF DISCHARGE:  09/02/2012  PRIMARY CARE PHYSICIAN:  Dr. Clydie Braunavid Jaycee Pelzer  DISCHARGE DIAGNOSES:  1. Acute bladder outlet obstruction.  2. Acute renal failure.  3. Constipation.  4. Hypokalemia.  5. Orthostatic hypotension.  6. Debility.   HISTORY OF PRESENT ILLNESS: This is a pleasant 74 year old gentleman who lives alone, has longstanding issues with orthostatic hypotension and had a recent admission for this.  He has actually recently had Adult Protective Services involved and is meeting with a Child psychotherapistsocial worker. He presented due to increasing difficulties with urination and constipation. He was unable to get out of bed had been unable to urinate or use the toilet for 3 to 4 days. He also had nausea and vomiting. In the Emergency Room he had a CT scan which showed some hydronephrosis of the right kidney and urinary bladder distention. His creatinine was elevated at 1.88, up from 1.22. He was admitted and a Foley catheter was placed and he was given IV fluids. Hospital course by issue:  1. Bladder outlet obstruction: This is presumably due to benign prostatic hypertrophy. The patient had a catheter placed for two days, but when it was removed he was still unable to void.  The catheter was replaced on 10/27 and the patient was started on finasteride.  He will be discharged home with a Foley catheter to leg bag and follow up in the clinic in one week. We cannot use alpha blockers on him given his long history of orthostatic hypotension and falls. Therefore, he may need some time with the catheter before his prostate shrinks enough with the finasteride. We will consider having him see urology as an outpatient.  2. Acute renal failure due to #1: This resolved with IV fluids and Foley catheter placement.  3. Constipation: Likely due to immobility and dehydration. This resolved and he is on a bowel  regimen now.  4. Immobility: The patient was seen by physical therapy who recommended a higher level of care but the patient was refusing. However, he has agreed to accept Home Health and physical therapy.  5. Hypokalemia: This was repleted.   DISCHARGE MEDICATIONS:  1. Potassium chloride 10 mEq 4 capsules twice a day.  2. Magnesium oxide 400 mg twice a day.  3. Phospha 250 Neutral,  250 mg twice a day.  4. Multivitamin once a day.  5. Florinef 0.1 mg 3 tablets a day.  6. Finasteride 5 mg once a day.  7. Colace 100 mg twice a day.  8. Dulcolax 10 mg orally p.r.n. constipation.   DISPOSITION:  Discharge to home with Home Health for home nursing and physical therapy. He also has a Child psychotherapistsocial worker through [employer]. He will need Foley care and physical therapy as well.    DISCHARGE DIET: Regular, high salt, regular consistency.    ACTIVITY: As tolerated.   FOLLOWUP:  The patient will follow up with Dr. Sampson GoonFitzgerald in 1 to 2 weeks.   DISCHARGE TIME: 40 minutes.   ____________________________ Stann Mainlandavid P. Sampson GoonFitzgerald, MD dpf:bjt D: 09/04/2012 13:42:35 ET T: 09/04/2012 14:31:45 ET JOB#: [id number]  cc: Stann Mainlandavid P. Sampson GoonFitzgerald, MD, <Dictator> Daylen Hack Sampson GoonFITZGERALD MD ELECTRONICALLY SIGNED 09/11/2012 18:45

## 2015-02-22 NOTE — Discharge Summary (Signed)
PATIENT NAME:  [name], [name] Brandon#:  [id number] DATE OF BIRTH:  [birth year]  DATE OF ADMISSION:  07/20/2012 DATE OF DISCHARGE:  07/21/2012  PRIMARY CARE PHYSICIAN:  Clydie Braunavid Camara Renstrom, MD  CONSULTING PHYSICIAN:  Darlin PriestlyKenneth A. Lady GaryFath, MD, Cardiology  DISCHARGE DIAGNOSES: 1. Orthostatic hypotension with syncope. 2. Acute renal failure, resolved. 3. Hypokalemia, resolved. 4. Hypophosphatemia. 5. Hypomagnesemia. 6. Possible pulmonary nodule. 7. Suspected patent foramen ovale and pulmonary hypertension. 8. Cervical spine stenosis.  HISTORY OF PRESENT ILLNESS:  [name] is a pleasant 74 year old gentleman who is relatively debilitated and was seen for the first time by me in clinic on the day of admission. At that time he reported a long history of orthostatic hypotension, weakness. He had been taking fludrocortisone for this. He had also been losing a significant amount of weight. The plan was to work him up for the weight loss and the orthostatic hypotension. However, when he went for a chest x-ray, he had a syncopal episode. At that point, his eyes rolled back into the back of his head when he stood up. He was supported by the x-ray staff and did not have any trauma. For further details of the admission, please see the admission history and physical.   HOSPITAL COURSE BY ISSUE: 1. Orthostatic hypotension. He was found to have significant orthostasis. He was also found to have profound hypokalemia, hypomagnesemia, and hypophosphatemia, which was felt to maybe be contributing to this. He was given IV fluids and had repletion of his electrolytes. He was seen by Cardiology, Dr Lady GaryFath. He had an echocardiogram done, which showed some pulmonary hypertension and possible PFO, although it was not felt this was contributing since he had no evidence of paradoxical strokes. Cortisol level was also checked and is pending at the time of discharge.  He, interestingly, had some supine hypertension but would become profoundly  hypotensive when he stood up. At the time of discharge he had much improved symptomatology but was still somewhat dizzy and orthostatic when he stood up.  2. Acute renal failure. His creatinine was elevated on admission. However, he was given IV fluids, and this resolved. 3. Electrolyte abnormalities. These were repleted orally and IV. 4. Possible pulmonary nodule noted on chest x-ray. This will need to be followed up radiologically.  5. History of C-spine stenosis. This was felt to be contributing to his weakness in his bilateral lower extremities and some of his debility.   DATA:  The patient had echocardiogram done, which was relatively normal except for the questionable PFO.  His labs on the day of discharge included phosphorous that was still a little low at 2.2. His creatinine had improved to 1.25. His potassium was up to 3.2. His calcium was low at 7.6. His magnesium the day prior was 1.4. These were being repleted. He had a serum protein electrophoresis with the results pending. He had an HIV test that was negative. He had a TSH that was 2.41. RPR negative. HIV negative. Sed rate 61.   DISCHARGE MEDICATIONS: 1. Florinef 0.1 mg 2 tablets a day. 2. Aspirin 81 mg once a day. 3. Etodolac 300 mg 1 tablet twice a day as needed. 4. Vitamin B12 of 1000 mg orally. 5. Potassium chloride 40 mEq twice a day. 6. Magnesium oxide 400 mg twice a day. 7. Phospha Neutral 250 mg once a day. 8. Multivitamin once a day.  DISCHARGE FOLLOWUP:  The patient is to follow up with Dr Sampson GoonFitzgerald within 1-2 weeks. Would consider referral to Endocrinology for  further evaluation.   DISCHARGE DIET:  Regular with actually high-salt diet.   DISCHARGE  CONDITION:  Fair.  DISCHARGE  ACTIVITY LEVEL:  As tolerated.  We had a long discussion about dangers of orthostasis. We discussed his ability to drive, and he insists that he is able to drive, as he has no orthoses or symptoms when he is sitting.   DISCHARGE TIME:  This  discharge took 45 minutes.  ____________________________ Stann Mainland. Sampson Goon, MD dpf:ljs D: 07/24/2012 16:26:00 ET T: 07/28/2012 09:57:47 ET JOB#: [id number]  cc: Iantha Fallen A. Lady Gary, MD Stann Mainland. Sampson Goon, MD, <Dictator>  Sumedha Munnerlyn Sampson Goon MD ELECTRONICALLY SIGNED 07/29/2012 18:41

## 2015-02-22 NOTE — Consult Note (Signed)
Brief Consult Note: Diagnosis: abd pain.   Patient was seen by consultant.   Consult note dictated.   Recommend further assessment or treatment.   Discussed with Attending MD.   Comments: Asked to see pt for sludge on GB U/s but pt has no RUQ tenderness. Very tender in LLQ and RLQ. DC'd from hospital 6 days ago for syncope and orthostatic hypotension. Mult electrolyte abn with inc creatinine. Suggested ER  physician get CT of abd and will follow in hospital depending on CT findings.  Electronic Signatures: Lattie Hawooper, Lashea Goda E (MD)  (Signed 26-Oct-13 05:05)  Authored: Brief Consult Note   Last Updated: 26-Oct-13 05:05 by Lattie Hawooper, Gemayel Mascio E (MD)

## 2015-02-22 NOTE — H&P (Signed)
PATIENT NAME:  [name], [name] MR#:  [id number] DATE OF BIRTH:  18-Jul-1941  DATE OF ADMISSION:  07/18/2012  CHIEF COMPLAINT: Weakness and presyncope.  HISTORY OF PRESENT ILLNESS: This is a 74 year old gentleman with a history of coronary artery disease and orthostatic hypotension who was seen in clinic today for the 1st time as primary care doctor and had an episode of syncope at x-ray.  During the visit he had complained of weakness in his lower extremities that had been chronic and had seen Neurology for it.  He does report taking Florinef for orthostatic hypotension.  He had been losing weight, has lost approximately 20 to 30 pounds over the last year or 2.  He had had no fevers, chills, night sweats, cough.  He does continue to smoke 1 to 2 packs a day.  He used to drink alcohol but no longer does.  During the event at the radiology unit he was standing up, and his eyes rolled back and he became very weak.  He was supported by staff and laid on the ground where he came to.  He had no trauma at the time.  PAST MEDICAL HISTORY:  1. Coronary artery disease.  He has had multiple catheterizations. 2. Long history of weakness and orthostatic hypotension.  Please see dictated note and hospital admission in 2011 for this. 3. Hyperlipidemia.  PAST SURGICAL HISTORY: Coronary stenting.  SOCIAL HISTORY: Patient lives alone.  He is divorced.  He has 2 children but is not in contact with them.  He continues to smoke 1 to 2 packs a day.  He used to drink heavily but says he quit 15 to 20 years ago.  FAMILY HISTORY: Noncontributory.  MEDICATIONS:  1. Florinef 0.1 mg 2 tablets a day. 2. Atorvastatin 20 mg once a day. 3. Aspirin 81 mg once a day.  REVIEW OF SYSTEMS: Eleven systems reviewed and negative except as per HPI.  ALLERGIES: No known drug allergies.  PHYSICAL EXAMINATION:    VITAL SIGNS: Patient is afebrile.  Blood pressure was initially 120/80, pulse 64.  Saturations were 98% on room  air.  GENERAL: He is a weak-appearing, elderly gentleman.  He is quite thin.  He is sitting in a wheelchair.  HEENT: His pupils equal, round, reactive to light and accommodation.  Extraocular movements are intact.  Sclerae anicteric.  Oropharynx is clear.  NECK: Supple.  HEART: His heart is regular.  LUNGS: Clear.  ABDOMEN: Soft, nontender, nondistended.  No hepatosplenomegaly.  EXTREMITIES: No clubbing, cyanosis or edema.    NEUROLOGIC: He has hyperreflexia.  He has diffuse weakness in his bilateral lower extremities. He is alert and oriented x3.  DATA: Patient's blood work, EKG, and chest x-ray are pending.  IMPRESSION: A 74 year old admitted with syncopal episode as well as weakness bilaterally and weight loss.  He is a current smoker and former alcohol user.  He lives alone and has a poor living situation it appears.  PLAN: 1. We will admit the patient for IV fluids and evaluation.  Check CBC, TSH, MET, EKG, echocardiogram.  We will have to review his records.  There is no need to repeat further inpatient workup for his weakness.  This can be done as an outpatient.  We will also check a cortisol level in the a.m.      2.  We will also ask Social Work to see the patient for discharge planning.      ____________________________ Cheral Marker. Ola Spurr, MD dpf:vtd D: 07/18/2012 74:16:38 ET T:  07/19/2012 06:16:40 ET JOB#: [id number]  cc: Cheral Marker. Ola Spurr, MD, <Dictator> Trayvond Viets Ola Spurr MD ELECTRONICALLY SIGNED 07/29/2012 18:41

## 2015-02-22 NOTE — Consult Note (Signed)
PATIENT NAME:  [name], [name] MR#:  [id number] DATE OF BIRTH:  1941-10-27  DATE OF CONSULTATION:  08/30/2012  REFERRING PHYSICIAN:   CONSULTING PHYSICIAN:  Adah Salvageichard E. Excell Seltzerooper, MD  CHIEF COMPLAINT: Abdominal pain.   HISTORY OF PRESENT ILLNESS: This is a patient with diffuse abdominal pain mostly in the lower quadrants. He has been in the ER for approximately 13 hours with abdominal pain. Fields was asked to see the patient because his gallbladder ultrasound finally returned showing sludge in the gallbladder and he has a very slightly elevated AST with a normal ALT. The Emergency Room physician asked me to see the patient for possible gallbladder disease, but on examination the patient was very tender in both lower quadrants, nontender in the right upper quadrant. The patient describes 2 to 3 days of abdominal pain, somewhat diffuse. He states he has had this before and it has always gone away on its own. He has vomited several times, bilious in nature. He denies fevers or chills. He has not had a bowel movement today. He denies jaundice or acholic stools.  He denies melena, hematochezia, or hematemesis.   Also of note is that the patient was just discharged from the hospital approximately 6 to 7 days ago with syncope and multiple electrolyte abnormalities including acute renal failure and returns with electrolyte abnormalities as well.   PAST MEDICAL HISTORY:  1. Orthostatic hypotension for which he takes Florinef.  2. Coronary artery disease. He has had stents placed but does not take Plavix. Aspirin is his only antiplatelet drug.   PAST SURGICAL HISTORY:  1. Appendectomy. 2. Coronary stent placement.   FAMILY HISTORY: Noncontributory.   SOCIAL HISTORY: The patient is not employed at this time. He smokes tobacco, 1 to 2 packs of cigarettes per day. He was a heavy drinker at one point, but does not drink at all now.   REVIEW OF SYSTEMS: 10-system review is performed and negative with the exception of  that mentioned in the history of present illness.   PHYSICAL EXAMINATION:  GENERAL: Healthy. Slightly uncomfortable -appearing black male patient, BMI of 22, 72 inches tall, 162 pounds.   VITAL SIGNS: He is afebrile at 97.7, pulse 64, respirations 20, blood pressure 160/79, 95% room air sat.   HEENT: No scleral icterus.   NECK: No palpable neck nodes.   CHEST: Clear to auscultation.   CARDIAC: Regular rate and rhythm.   ABDOMEN: Showing guarding in both lower quadrants with percussion tenderness but no rebound tenderness. He is maximally tender in the left lower quadrant, completely nontender in the right upper quadrant with a negative Murphy's sign. There is a right lower quadrant scar. He seems slightly distended and tympanitic as well.   EXTREMITIES: Without edema. Calves are nontender.   NEUROLOGIC: Grossly intact.   INTEGUMENT: No jaundice. Additionally, the patient's fingers show clubbing.  LABORATORY, DIAGNOSTIC, AND RADIOLOGICAL DATA: An ultrasound report suggests sludge in the gallbladder. Urinalysis is negative. Lipase 47. Electrolytes show BUN of 20, creatinine 1.9, sodium 139, potassium 3.1, bilirubin 1.1, AST 61, ALT 16, alkaline phosphatase 94. Serum albumin is 3.9. White blood cell count 11.6, hemoglobin and hematocrit 16 and 48, platelet count 213.   ASSESSMENT AND PLAN: This is a patient with abdominal pain in the lower quadrants. Fields was asked to see the patient because the only thing that had been found abnormal was his elevated white blood cell count and sludge on his gallbladder ultrasound. He is completely nontender in the right upper quadrant with  a negative Murphy's sign and although his history prior to physical exam might suggest biliary colic and the fact that he has had this happen multiple times and he has been vomiting as well, at this point he has no tenderness in the right upper quadrant and he has maximal tenderness in both lower quadrants with some mild  peritoneal irritation in the form of guarding and percussion tenderness and he is slightly distended.   Fields discussed with the Emergency Room physician my findings and suggested that this is not likely to be gallbladder disease and that he needs a CT scan to evaluate the rest of his abdomen. He does have renal failure and Fields suggested oral contrast only, and Fields would be happy to follow this patient while he is in the hospital. Because of his multiple other medical problems including electrolyte abnormalities and coronary artery disease and the fact that he has just been discharged from the hospital several days ago for syncope and orthostatic hypotension, Fields suggested that the medical team admit him and we would be happy to follow him in the hospital. While this may be gallbladder disease, ultimately we need to investigate other alternative causes. He currently shows no sign of acidosis to suggest ischemia but he is quite tender and requires further work-up. The Emergency Room physician was in agreement with this plan and we will follow him while he is in the hospital.   ____________________________ Adah Salvage. Excell Seltzer, MD rec:bjt D: 08/30/2012 05:13:24 ET T: 08/30/2012 13:15:50 ET JOB#: [id number]  cc: Adah Salvage. Excell Seltzer, MD, <Dictator> Lattie Haw MD ELECTRONICALLY SIGNED 09/05/2012 19:47

## 2015-02-26 NOTE — Discharge Summary (Signed)
PATIENT NAME:  [name], [name]:  [id number] DATE OF BIRTH:  August 26, 1941  DATE OF ADMISSION:  06/04/2014 DATE OF DISCHARGE:  06/07/2014  ADMITTING PHYSICIAN:  Hilda LiasVivek Sainani, MD.  DISCHARGING PHYSICIAN: Enid Baasadhika Tasmia Blumer, MD.   PRIMARY CARE PHYSICIAN: None.   CONSULTATIONS IN THE HOSPITAL: Neurology consultation with Dr. Loretha BrasilZeylikman.   DISCHARGE DIAGNOSES: 1.  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  2.  Benign prostatic hypertrophy.  3.  Urinary retention.  4.  Hypertension.  5.  Urinary tract infection.  6.  Acute renal failure.  7.  Systolic congestive heart failure.   DISCHARGE HOME MEDICATIONS:  1.  Lisinopril 20 mg p.o. daily.  2.  Flomax 0.4 mg p.o. daily.  3.  Simvastatin 20 mg p.o. daily.  4.  Norvasc 10 mg p.o. daily.  5.  Senokot 2 tablets p.o. daily, hold if greater than 2 bowel movements per day.  6.  Aspirin 81 mg p.o. daily.   DISCHARGE DIET: Low sodium diet.   DISCHARGE ACTIVITY: As tolerated.   FOLLOWUP INSTRUCTIONS:  1.  PCP follow-up in 1 week.  2.  Physical therapy and occupational therapy.  3.  Speech therapy.   LABORATORY DATA AND IMAGING STUDIES:  Prior to discharge: Sodium 143, potassium 3.7, chloride 114, bicarbonate 22, BUN 16, creatinine 1.2, glucose 78 and calcium 7.5.  WBC 5.6, hemoglobin 12.4, hematocrit 37.9, platelet count 172,000. LDL is 83, HDL 35, total cholesterol 045129, triglycerides of 56. Blood cultures negative.   Ultrasound Doppler examination of the carotid arteries showed trace heterogeneous but relatively smooth plaque in both carotid bifurcations. No evidence of ICA stenosis. Vertebral arteries remain patent.   MRI of the brain without contrast showing acute/subacute infarct in the left insular cortex and frontal operculum, remote lacunar infarcts in the left basal ganglia and left cerebellum noted. Moderate generalized atrophy and white matter disease and  right lateral mastoid effusions noted.   Echo Doppler showing ejection fraction of 35-40%, moderately decreased global systolic function.   CT of the C-spine without contrast showed left frontoparietal infarct, but no acute changes involving the cervical spine.   Urine cultures are negative. Urinalysis positive for infection.    BRIEF HOSPITAL COURSE: Mr. [name] is a [age] year old African American male with history of hypertension, systolic CHF, not seeing a primary care physician, not taking any medications at home.  He was brought in after he was found down at [employer]. 1.  Acute cerebrovascular accident with CT head showing left frontoparietal infarct and MRI of the brain showing acute infarct in the left insular cortex. Likely cause is hypertension. The patient is not taking any medications, no followup.  He was started on aspirin and statin here in the hospital.  He was seen by a neurologist. He has minimal right arm weakness and slurred speech but he needs a lot of assistance moving around so physical therapy recommended rehabilitation. He needs physical therapy, occupational therapy, and speech therapy. However, the patient initially refused rehabilitation but is not safe enough to go back home with home health. He was finally convinced and agreed to go to rehabilitation at this time.  2.  Hypertension. Blood pressure is controlled with lisinopril and Norvasc at this time.  3.  Acute renal failure and urinary tract infection.  The patient was given IV fluids. Likely prerenal. Cultures negative. He is on Rocephin, received 3 days of treatment.   4.  Benign prostatic hypertrophy and urinary  retention.  Initially, he required a Foley catheter. However, he is now able to void after starting Flomax. He will need outpatient urology follow-up.   His course has been otherwise uneventful in the hospital.   DISCHARGE CONDITION: Stable.   DISCHARGE DISPOSITION: To short-term rehabilitation.    Time spent on discharge: 40 minutes.    ____________________________ Enid Baas, MD rk:lt D: 06/07/2014 13:19:43 ET T: 06/07/2014 13:50:27 ET JOB#: [id number]  cc: Enid Baas, MD, <Dictator> Enid Baas MD ELECTRONICALLY SIGNED 06/22/2014 13:55

## 2015-02-26 NOTE — Consult Note (Signed)
PATIENT NAME:  [name], Keawe I MR#:  [id number] DATE OF BIRTH:  09-10-1941  DATE OF CONSULTATION:  06/05/2014    CONSULTING PHYSICIAN:  Pauletta BrownsYuriy Deshannon Seide, MD  REASON FOR CONSULTATION:  Stroke.  HISTORY OF PRESENT ILLNESS: This 74 year old gentleman brought into the EMS status post being found down earlier at home. Apparently, mental status was altered, found to have significant elevated blood pressure.  On further evaluation of the CAT scan, the patient had, what appears to be, a left frontoparietal infarct as well as urinary tract infection, which is currently being treated with antibiotics. Review of systems otherwise unremarkable except what is mentioned above.  PAST MEDICAL HISTORY:  BPH, bladder outlet obstructions, history of urinary tract infections.   ALLERGIES: MORPHINE WHICH CAUSES RASH.   SOCIAL HISTORY: Does smoke a pack per day. No alcohol. No illicit drug use.   FAMILY HISTORY: Consisted of with MIs.   HOME MEDICATIONS: Only Florinef.   LABORATORY DATA: Workup is reviewed. Imaging as described above. The patient is status post MRI of the brain, shows acute, subacute infarct on left insular cortex and older remote infarct and left basal ganglia.    PHYSICAL EXAMINATION: VITAL SIGNS:  The patient's flow sheets include a temperature of 98.9, pulse 67, respirations 18, blood pressure 172/79.  NEUROLOGIC: The patient is alert, awake to place, his full name and the month. Facial sensation intact. Facial motor is intact. Uvula elevates symmetrically. Shoulder shrug intact. Visual fields appear to be intact. Pupils are round, and reactive, symmetrical, appears to be minimal right upper extremity drift and no drift on the right lower extremities. All other extremities are symmetrical. Reflexes are diminished throughout. Sensation intact to light touch and temperature. Coordination intact. Gait not assessed.   IMPRESSION: A 74 year old gentleman found down and found to have a urinary tract  infection, found to have left frontal and parietal infarct. Did not appear to be antiplatelet at home.   PLAN: Continue the ordered stroke workup. The patient is currently receiving carotid Dopplers. MRI as above. Continue antiplatelet therapy, aspirin daily, as well as statin. Physical therapy, occupational therapy, speech therapy. Anticipate discharge from a neurological standpoint in a day or 2.   Thank you. It was a pleasure seeing this patient.    ____________________________ Pauletta BrownsYuriy Joylene Wescott, MD yz:ds D: 06/05/2014 12:14:14 ET T: 06/05/2014 14:02:31 ET JOB#: [id number]  cc: Pauletta BrownsYuriy Muntaha Vermette, MD, <Dictator> Pauletta BrownsYURIY Dyani Babel MD ELECTRONICALLY SIGNED 06/08/2014 21:16

## 2015-02-26 NOTE — H&P (Signed)
PATIENT NAME:  [name], Laythan I MR#:  [id number] DATE OF BIRTH:  December 28, 1940  DATE OF ADMISSION:  06/04/2014  PRIMARY CARE PHYSICIAN: Does not have one.   CHIEF COMPLAINT: Being found down.   HISTORY OF PRESENT ILLNESS: This is a 74 year old male who was brought into the hospital via EMS as he was found down in his home earlier today. The patient apparently gets meals delivered by [employer] and they found him collapsed on the floor and called EMS. The patient when EMS arrived his blood sugar was stable. He was noted to be slightly hypertensive and his neurological exam otherwise was benign. He was brought to the ER for further evaluation. The patient himself is a very poor historian, therefore, most of the history is obtained from the chart. The patient on workup was noted to have an acute left frontoparietal CVA and also noted to be in acute renal failure and noted to have urinary tract infection. Hospitalist services were contacted for further treatment and evaluation.   REVIEW OF SYSTEMS: Otherwise unobtainable given the patient's mental status.  PAST MEDICAL HISTORY: Consistent with history of BPH, bladder outlet obstruction, history of acute renal failure, debility and history of previous UTIs.   ALLERGIES: MORPHINE WHICH CAUSES RASH.   SOCIAL HISTORY: He does smoke about a pack per day, has been smoking for many years. No alcohol abuse. No illicit drug abuse. Lives at home by himself.   FAMILY HISTORY: Mother and father are both deceased. Mother died from MI. Father died from kidney failure.   CURRENT MEDICATIONS: He is just on Florinef 0.1 mg 3 tabs daily.   PHYSICAL EXAMINATION:  VITAL SIGNS: Presently temperature is 98.2, pulse 72, respirations 16, blood pressure 211/103 and sats 98% on room air.  GENERAL: He is a very unkempt-appearing male but in no apparent distress.  HEAD, EYES, EARS, NOSE, THROAT: He is atraumatic, normocephalic. Extraocular muscles are intact. Pupils are  equal and reactive to light. Sclerae anicteric. No conjunctival injection. No oropharyngeal erythema. Oral mucosa is dry.  NECK: Supple. There is no jugular venous distention. No bruits, no lymphadenopathy and no thyromegaly.  HEART: Regular rate and rhythm. No murmurs, no rubs and no clicks.  LUNGS: Clear to auscultation bilaterally. No rales, no rhonchi and no wheezes.  ABDOMEN: Soft, flat, nontender, nondistended. Has good bowel sounds. No hepatosplenomegaly appreciated.  EXTREMITIES: No evidence of any cyanosis, clubbing, or peripheral edema. Has +2 pedal and radial pulses bilaterally.  NEUROLOGIC: He is alert, awake, and oriented x1. Globally weak. Moves all extremities spontaneously. No other focal motor or sensory deficits appreciated bilaterally. Babinski's are downgoing bilaterally. Reflexes are +2 bilaterally.  SKIN: Moist and warm with no rashes.  LYMPHATIC: There is no cervical or axillary lymphadenopathy.   DIAGNOSTIC DATA: Serum glucose 98, BUN 25, creatinine 2.07, sodium 144, potassium 3.7, chloride 114, bicarb. Troponin less than 0.03. White cell count 5.7, hemoglobin 12.2, hematocrit 38.6, platelet count 194,000. Urinalysis shows 6 white cells and 1+ bacteria.   The patient did have a CT of the head done and also CT of the cervical spine done. CT head shows chronic changes and consistent with left frontoparietal infarct. CT cervical spine shows degenerative changes without acute abnormality. The patient also had a chest x-ray done which showed no acute disease.   ASSESSMENT AND PLAN: This is a 74 year old male with a history of benign prostatic hypertrophy, bladder outlet obstruction, hypertension and history of acute renal failure who presents to the hospital after being  found down by [employer] at home. The patient was noted to have an acute stroke on the CT and also noted to be in acute renal failure with a urinary tract infection.  1.  Acute cerebrovascular accident. The  patient's CT does show a left frontoparietal cerebrovascular accident, although he is not aphasic. He has no other focal deficits. Exam he is globally weak. For now I will follow q. 4 hour neuro checks. I will not get a MRI as the patient's CT already shows a stroke. I will get a carotid duplex and a two-dimensional echocardiogram. I will start the patient on aspirin and check a lipid profile. We will get a physical therapy and speech evaluation and also get a neurological evaluation.  2.  Hypertension. The patient's blood pressure is currently significantly elevated, likely due to Cushing reflex from his acute stroke. I will give him some p.r.n. hydralazine and follow hemodynamics and try to keep his blood pressures with systolics close to 160s to 170s, diastolics in the 90s.  3.  Urinary tract infection. Continue IV ceftriaxone. Follow urine cultures.  4.  Acute renal failure. This is likely secondary to dehydration and poor p.o. intake. I will give the patient IV fluids and follow BUN and creatinine.   CODE STATUS: The patient is a FULL code.   TIME SPENT ON ADMISSION: 50 minutes.   ____________________________ Rolly Pancake. Cherlynn Kaiser, MD vjs:sb D: 06/04/2014 14:53:09 ET T: 06/04/2014 15:20:06 ET JOB#: [id number]  cc: Rolly Pancake. Cherlynn Kaiser, MD, <Dictator> Houston Siren MD ELECTRONICALLY SIGNED 06/18/2014 14:22

## 2019-03-06 DEATH — deceased
